# Patient Record
Sex: Male | Born: 1985 | Race: White | Hispanic: Yes | Marital: Single | Smoking: Never smoker
Health system: Southern US, Community
[De-identification: ages and names within clinical notes are randomized; demographics above are authoritative.]

---

## 2015-12-25 ENCOUNTER — Emergency Department (HOSPITAL_COMMUNITY): Payer: Self-pay

## 2015-12-25 ENCOUNTER — Emergency Department (HOSPITAL_COMMUNITY)
Admission: EM | Admit: 2015-12-25 | Discharge: 2015-12-26 | Disposition: A | Discharge status: Home / Self Care | Payer: Self-pay | Attending: Emergency Medicine | Admitting: Emergency Medicine

## 2015-12-25 ENCOUNTER — Encounter (HOSPITAL_COMMUNITY): Payer: Self-pay | Admitting: Emergency Medicine

## 2015-12-25 DIAGNOSIS — S21132A Puncture wound without foreign body of left front wall of thorax without penetration into thoracic cavity, initial encounter: Secondary | ICD-10-CM | POA: Insufficient documentation

## 2015-12-25 DIAGNOSIS — T148XXA Other injury of unspecified body region, initial encounter: Secondary | ICD-10-CM

## 2015-12-25 DIAGNOSIS — S0081XA Abrasion of other part of head, initial encounter: Secondary | ICD-10-CM | POA: Insufficient documentation

## 2015-12-25 DIAGNOSIS — Y998 Other external cause status: Secondary | ICD-10-CM | POA: Insufficient documentation

## 2015-12-25 DIAGNOSIS — S4991XA Unspecified injury of right shoulder and upper arm, initial encounter: Secondary | ICD-10-CM | POA: Insufficient documentation

## 2015-12-25 DIAGNOSIS — Y9389 Activity, other specified: Secondary | ICD-10-CM | POA: Insufficient documentation

## 2015-12-25 DIAGNOSIS — T1490XA Injury, unspecified, initial encounter: Secondary | ICD-10-CM

## 2015-12-25 DIAGNOSIS — S30812A Abrasion of penis, initial encounter: Secondary | ICD-10-CM | POA: Insufficient documentation

## 2015-12-25 DIAGNOSIS — Y92009 Unspecified place in unspecified non-institutional (private) residence as the place of occurrence of the external cause: Secondary | ICD-10-CM | POA: Insufficient documentation

## 2015-12-25 DIAGNOSIS — S6991XA Unspecified injury of right wrist, hand and finger(s), initial encounter: Secondary | ICD-10-CM | POA: Insufficient documentation

## 2015-12-25 DIAGNOSIS — S30811A Abrasion of abdominal wall, initial encounter: Secondary | ICD-10-CM | POA: Insufficient documentation

## 2015-12-25 DIAGNOSIS — Z23 Encounter for immunization: Secondary | ICD-10-CM | POA: Insufficient documentation

## 2015-12-25 LAB — PREPARE FRESH FROZEN PLASMA
Unit division: 0
Unit division: 0

## 2015-12-25 LAB — ABO/RH: ABO/RH(D): O POS

## 2015-12-25 LAB — TYPE AND SCREEN
ABO/RH(D): O POS
ANTIBODY SCREEN: NEGATIVE
Unit division: 0
Unit division: 0

## 2015-12-25 MED ORDER — LIDOCAINE-EPINEPHRINE 1 %-1:100000 IJ SOLN
20.0000 mL | Freq: Once | INTRAMUSCULAR | Status: AC
  Administered 2015-12-25: 20 mL via INTRADERMAL
  Filled 2015-12-25: qty 1

## 2015-12-25 MED ORDER — TETANUS-DIPHTH-ACELL PERTUSSIS 5-2.5-18.5 LF-MCG/0.5 IM SUSP
0.5000 mL | Freq: Once | INTRAMUSCULAR | Status: AC
  Administered 2015-12-25: 0.5 mL via INTRAMUSCULAR
  Filled 2015-12-25: qty 0.5
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
## 2015-12-25 NOTE — ED Notes (Signed)
Patient placed in soft c-collar.

## 2015-12-25 NOTE — ED Notes (Addendum)
Family at bedside. 
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
